# Patient Record
Sex: Female | Born: 2005 | Hispanic: No | Marital: Single | Smoking: Never smoker
Health system: Southern US, Community
[De-identification: ages and names within clinical notes are randomized; demographics above are authoritative.]

---
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
## 2024-03-12 DIAGNOSIS — R0600 Dyspnea, unspecified: Secondary | ICD-10-CM | POA: Diagnosis not present

## 2024-06-17 ENCOUNTER — Other Ambulatory Visit: Payer: Self-pay

## 2024-06-17 ENCOUNTER — Emergency Department (HOSPITAL_COMMUNITY)
Admission: EM | Admit: 2024-06-17 | Discharge: 2024-06-18 | Disposition: A | Discharge status: Home / Self Care | Attending: Emergency Medicine | Admitting: Emergency Medicine

## 2024-06-17 DIAGNOSIS — T572X1A Toxic effect of manganese and its compounds, accidental (unintentional), initial encounter: Secondary | ICD-10-CM | POA: Diagnosis not present

## 2024-06-17 DIAGNOSIS — R9431 Abnormal electrocardiogram [ECG] [EKG]: Secondary | ICD-10-CM | POA: Diagnosis not present

## 2024-06-17 DIAGNOSIS — T6591XA Toxic effect of unspecified substance, accidental (unintentional), initial encounter: Secondary | ICD-10-CM

## 2024-06-17 DIAGNOSIS — T50901A Poisoning by unspecified drugs, medicaments and biological substances, accidental (unintentional), initial encounter: Secondary | ICD-10-CM | POA: Diagnosis not present

## 2024-06-17 LAB — BASIC METABOLIC PANEL WITH GFR
Anion gap: 7 (ref 5–15)
BUN: 11 mg/dL (ref 6–20)
CO2: 27 mmol/L (ref 22–32)
Calcium: 9.2 mg/dL (ref 8.9–10.3)
Chloride: 103 mmol/L (ref 98–111)
Creatinine, Ser: 0.73 mg/dL (ref 0.44–1.00)
GFR, Estimated: >60 mL/min (ref >60)
Glucose, Bld: 125 mg/dL — ABNORMAL HIGH (ref 70–99)
Potassium: 3.3 mmol/L — ABNORMAL LOW (ref 3.5–5.1)
Sodium: 137 mmol/L (ref 135–145)

## 2024-06-17 LAB — MAGNESIUM: Magnesium: 2.8 mg/dL — ABNORMAL HIGH (ref 1.7–2.4)

## 2024-06-17 LAB — TROPONIN I (HIGH SENSITIVITY): Troponin I (High Sensitivity): <2 ng/L (ref <18)

## 2024-06-17 NOTE — ED Triage Notes (Signed)
 At 11am pt took 1000mg  of magnesium instead of 120mg . Pt adamant that this was not an attempt to harm self. She just misread dosing. pT felt tired, SOB, CP and had diarrhea. Pt alert in triage and able to answer questions

## 2024-06-17 NOTE — ED Notes (Signed)
 Called poison control and ordered requested labs and EKG. PA aware

## 2024-06-18 LAB — TROPONIN I (HIGH SENSITIVITY): Troponin I (High Sensitivity): <2 ng/L (ref <18)

## 2024-06-18 LAB — MAGNESIUM: Magnesium: 2.6 mg/dL — ABNORMAL HIGH (ref 1.7–2.4)

## 2024-06-18 MED ORDER — SODIUM CHLORIDE 0.9 % IV BOLUS: 1000.0000 mL | Freq: Once | INTRAVENOUS | Status: DC

## 2024-06-18 MED ORDER — LIDOCAINE VISCOUS HCL 2 % MT SOLN
15.0000 mL | Freq: Once | OROMUCOSAL | Status: AC
  Administered 2024-06-18: 15 mL via ORAL
  Filled 2024-06-18: qty 15

## 2024-06-18 MED ORDER — ALUM & MAG HYDROXIDE-SIMETH 200-200-20 MG/5ML PO SUSP
30.0000 mL | Freq: Once | ORAL | Status: AC
  Administered 2024-06-18: 30 mL via ORAL
  Filled 2024-06-18: qty 30

## 2024-06-18 NOTE — Discharge Instructions (Signed)
 Today you were seen for an accidental ingestion of magnesium.  Please be sure to read medication labels carefully in the future.  If you continue to have GI upset you may take over-the-counter antacids like Pepcid.  Please follow-up with your primary care if your symptoms persist for further evaluation and workup.  Thank you for letting us  treat you today. After reviewing your labs, I feel you are safe to go home. Please follow up with your PCP in the next several days and provide them with your records from this visit. Return to the Emergency Room if pain becomes severe or symptoms worsen.

## 2024-06-18 NOTE — ED Provider Notes (Signed)
 [place] EMERGENCY DEPARTMENT AT [facility] Provider Note   CSN: [id number] Arrival date & time: 06/17/24  2046     Patient presents with: Ingestion   Terri Hunter is a 18 y.o. female presents today after accidentally taking 1 g of magnesium instead of 120 mg around 11 AM yesterday morning.  Patient states that she was not attempting to harm herself.  Patient reports initially fatigue, shortness of breath, chest pain, and an episode of diarrhea.  Patient states she now is only having a burning sensation in her chest and GI upset.  Patient denies numbness, tingling, nausea, vomiting, fever, chills, or shortness of breath.      Ingestion Associated symptoms include chest pain and abdominal pain.       Prior to Admission medications   Not on File    Allergies: Patient has no known allergies.    Review of Systems  Cardiovascular:  Positive for chest pain.  Gastrointestinal:  Positive for abdominal pain.    Updated Vital Signs BP 120/72   Pulse 71   Temp 98 F (36.7 C)   Resp 18   Ht 5' 2 (1.575 m)   Wt 54.4 kg   LMP 06/15/2024   SpO2 100%   BMI 21.95 kg/m   Physical Exam Vitals and nursing note reviewed.  Constitutional:      General: She is not in acute distress.    Appearance: Normal appearance. She is well-developed. She is not ill-appearing.  HENT:     Head: Normocephalic and atraumatic.     Right Ear: External ear normal.     Left Ear: External ear normal.     Nose: Nose normal.     Mouth/Throat:     Mouth: Mucous membranes are moist.     Pharynx: Oropharynx is clear.  Eyes:     Extraocular Movements: Extraocular movements intact.     Conjunctiva/sclera: Conjunctivae normal.  Cardiovascular:     Rate and Rhythm: Normal rate and regular rhythm.     Pulses: Normal pulses.     Heart sounds: Normal heart sounds. No murmur heard. Pulmonary:     Effort: Pulmonary effort is normal. No respiratory distress.     Breath sounds: Normal  breath sounds.  Abdominal:     General: There is no distension.     Palpations: Abdomen is soft.     Tenderness: There is no abdominal tenderness.  Musculoskeletal:        General: No swelling.     Cervical back: Neck supple.  Skin:    General: Skin is warm and dry.     Capillary Refill: Capillary refill takes less than 2 seconds.  Neurological:     General: No focal deficit present.     Mental Status: She is alert and oriented to person, place, and time.  Psychiatric:        Mood and Affect: Mood normal.     (all labs ordered are listed, but only abnormal results are displayed) Labs Reviewed  BASIC METABOLIC PANEL WITH GFR - Abnormal; Notable for the following components:      Result Value   Potassium 3.3 (*)    Glucose, Bld 125 (*)    All other components within normal limits  MAGNESIUM - Abnormal; Notable for the following components:   Magnesium 2.8 (*)    All other components within normal limits  MAGNESIUM - Abnormal; Notable for the following components:   Magnesium 2.6 (*)    All other components  within normal limits  TROPONIN I (HIGH SENSITIVITY)  TROPONIN I (HIGH SENSITIVITY)    EKG: EKG Interpretation Date/Time:  Thursday June 17 2024 21:25:59 EDT Ventricular Rate:  84 PR Interval:  140 QRS Duration:  86 QT Interval:  328 QTC Calculation: 387 R Axis:   87  Text Interpretation: Normal sinus rhythm Normal ECG When compared with ECG of 02-Aug-2023 02:40, PREVIOUS ECG IS PRESENT No significant change was found Confirmed by Trine Likes [phone]) on 06/18/2024 4:04:39 AM  Radiology: No results found.   Procedures   Medications Ordered in the ED  sodium chloride  0.9 % bolus 1,000 mL (has no administration in time range)  alum & mag hydroxide-simeth (MAALOX/MYLANTA) 200-200-20 MG/5ML suspension 30 mL (30 mLs Oral Given 06/18/24 0350)    And  lidocaine  (XYLOCAINE ) 2 % viscous mouth solution 15 mL (15 mLs Oral Given 06/18/24 0350)                                     Medical Decision Making Amount and/or Complexity of Data Reviewed Labs: ordered.  Risk OTC drugs. Prescription drug management.   This patient presents to the ED for concern of ingestion differential diagnosis includes arrhythmia, GI upset, overdose, kidney injury, liver injury  Lab Tests:  I Ordered, and personally interpreted labs.  The pertinent results include: Mild hypokalemia 3.3, delta troponin less than 2 EKG which showed normal sinus rhythm Repeat EKG which showed sinus rhythm Magnesium 2.8, repeat 2.6   Medicines ordered and prescription drug management:  I ordered medication including GI cocktail    I have reviewed the patients home medicines and have made adjustments as needed   Problem List / ED Course:  Poison control contacted and labs were ordered accordingly.  At the time of my exam Poison control was recontacted and stated that after repeat magnesium and EKG, if these were normal patient could be discharged safely. Considered for admission or further workup however patient's vital signs, physical exam, and labs are reassuring.  Patient observed for recommended time and labs are reassuring.  Patient given short course of an acid for GI upset outpatient.  Patient given return precautions.  I feel patient is safe for discharge at this time.     Final diagnoses:  Ingestion of substance, accidental or unintentional, initial encounter    ED Discharge Orders     None          Francis Ileana LOISE DEVONNA 06/18/24 0438    Trine Likes Moder, MD 06/18/24 [phone]

## 2024-06-21 DIAGNOSIS — Z1389 Encounter for screening for other disorder: Secondary | ICD-10-CM | POA: Diagnosis not present

## 2024-06-21 DIAGNOSIS — E876 Hypokalemia: Secondary | ICD-10-CM | POA: Diagnosis not present

## 2024-06-21 DIAGNOSIS — N946 Dysmenorrhea, unspecified: Secondary | ICD-10-CM | POA: Diagnosis not present

## 2024-06-21 DIAGNOSIS — F419 Anxiety disorder, unspecified: Secondary | ICD-10-CM | POA: Diagnosis not present
